# Patient Record
Sex: Male | Born: 1990 | Race: Black or African American | Hispanic: No | Marital: Single | Smoking: Never smoker
Health system: Southern US, Community
[De-identification: ages and names within clinical notes are randomized; demographics above are authoritative.]

---

## 2009-02-19 ENCOUNTER — Emergency Department (HOSPITAL_COMMUNITY): Admission: EM | Admit: 2009-02-19 | Discharge: 2009-02-19 | Payer: Self-pay | Admitting: Emergency Medicine

## 2009-06-21 ENCOUNTER — Emergency Department (HOSPITAL_COMMUNITY): Admission: EM | Admit: 2009-06-21 | Discharge: 2009-06-21 | Payer: Self-pay | Admitting: Emergency Medicine

## 2009-09-30 ENCOUNTER — Emergency Department (HOSPITAL_COMMUNITY): Admission: EM | Admit: 2009-09-30 | Discharge: 2009-09-30 | Payer: Self-pay | Admitting: Emergency Medicine

## 2009-11-12 ENCOUNTER — Emergency Department (HOSPITAL_BASED_OUTPATIENT_CLINIC_OR_DEPARTMENT_OTHER): Admission: EM | Admit: 2009-11-12 | Discharge: 2009-11-12 | Payer: Self-pay | Admitting: Emergency Medicine

## 2009-11-13 ENCOUNTER — Emergency Department (HOSPITAL_BASED_OUTPATIENT_CLINIC_OR_DEPARTMENT_OTHER): Admission: EM | Admit: 2009-11-13 | Discharge: 2009-11-14 | Payer: Self-pay | Admitting: Emergency Medicine

## 2009-11-14 ENCOUNTER — Encounter (INDEPENDENT_AMBULATORY_CARE_PROVIDER_SITE_OTHER): Payer: Self-pay | Admitting: Otolaryngology

## 2009-11-14 ENCOUNTER — Ambulatory Visit: Payer: Self-pay | Admitting: Diagnostic Radiology

## 2009-11-14 ENCOUNTER — Inpatient Hospital Stay (HOSPITAL_COMMUNITY): Admission: EM | Admit: 2009-11-14 | Discharge: 2009-11-16 | Payer: Self-pay | Admitting: Emergency Medicine

## 2010-06-12 LAB — CBC
HCT: 34.8 % — ABNORMAL LOW (ref 39.0–52.0)
HCT: 39 % (ref 39.0–52.0)
MCH: 27 pg (ref 26.0–34.0)
MCHC: 32.2 g/dL (ref 30.0–36.0)
MCV: 80.6 fL (ref 78.0–100.0)
MCV: 83.8 fL (ref 78.0–100.0)
RBC: 4.32 MIL/uL (ref 4.22–5.81)
RBC: 4.66 MIL/uL (ref 4.22–5.81)
RDW: 12.9 % (ref 11.5–15.5)
RDW: 12.2 % (ref 11.5–15.5)
WBC: 19 10*3/uL — ABNORMAL HIGH (ref 4.0–10.5)

## 2010-06-12 LAB — CULTURE, ROUTINE-SINUS

## 2010-06-12 LAB — DIFFERENTIAL
Basophils Absolute: 0 10*3/uL (ref 0.0–0.1)
Basophils Absolute: 0.2 10*3/uL — ABNORMAL HIGH (ref 0.0–0.1)
Basophils Relative: 0 % (ref 0–1)
Eosinophils Absolute: 0 10*3/uL (ref 0.0–0.7)
Eosinophils Absolute: 0 10*3/uL (ref 0.0–0.7)
Lymphocytes Relative: 14 % (ref 12–46)
Lymphocytes Relative: 9 % — ABNORMAL LOW (ref 12–46)
Monocytes Absolute: 3 10*3/uL — ABNORMAL HIGH (ref 0.1–1.0)
Monocytes Relative: 16 % — ABNORMAL HIGH (ref 3–12)
Neutro Abs: 8.2 10*3/uL — ABNORMAL HIGH (ref 1.7–7.7)

## 2010-06-12 LAB — BASIC METABOLIC PANEL
Creatinine, Ser: 1 mg/dL (ref 0.4–1.5)
Potassium: 3.9 mEq/L (ref 3.5–5.1)

## 2010-06-12 LAB — ANAEROBIC CULTURE

## 2014-04-30 ENCOUNTER — Encounter (HOSPITAL_BASED_OUTPATIENT_CLINIC_OR_DEPARTMENT_OTHER): Payer: Self-pay | Admitting: *Deleted

## 2014-04-30 ENCOUNTER — Emergency Department (HOSPITAL_BASED_OUTPATIENT_CLINIC_OR_DEPARTMENT_OTHER)
Admission: EM | Admit: 2014-04-30 | Discharge: 2014-04-30 | Disposition: A | Discharge status: Home / Self Care | Payer: Self-pay | Attending: Emergency Medicine | Admitting: Emergency Medicine

## 2014-04-30 DIAGNOSIS — Z792 Long term (current) use of antibiotics: Secondary | ICD-10-CM | POA: Insufficient documentation

## 2014-04-30 DIAGNOSIS — H109 Unspecified conjunctivitis: Secondary | ICD-10-CM | POA: Insufficient documentation

## 2014-04-30 MED ORDER — FLUORESCEIN SODIUM 1 MG OP STRP
1.0000 | ORAL_STRIP | Freq: Once | OPHTHALMIC | Status: AC
  Administered 2014-04-30: 1 via OPHTHALMIC
  Filled 2014-04-30: qty 1

## 2014-04-30 MED ORDER — ERYTHROMYCIN 5 MG/GM OP OINT: TOPICAL_OINTMENT | OPHTHALMIC | Status: DC

## 2014-04-30 MED ORDER — TETRACAINE HCL 0.5 % OP SOLN
2.0000 [drp] | Freq: Once | OPHTHALMIC | Status: AC
  Administered 2014-04-30: 2 [drp] via OPHTHALMIC
  Filled 2014-04-30: qty 2

## 2014-04-30 NOTE — ED Notes (Signed)
C/o left eye being irritated  Denies inj  Woke w eye irritated

## 2014-04-30 NOTE — Discharge Instructions (Signed)

## 2014-04-30 NOTE — ED Provider Notes (Signed)
CSN: [id number]     Arrival date & time 04/30/14  0326 History   None    Chief Complaint  Patient presents with  . Eye Pain     (Consider location/radiation/quality/duration/timing/severity/associated sxs/prior Treatment) Patient is a 24 y.o. male presenting with conjunctivitis.  Conjunctivitis This is a new problem. The current episode started yesterday. The problem occurs constantly. The problem has not changed since onset.Pertinent negatives include no chest pain, no abdominal pain and no shortness of breath. Nothing aggravates the symptoms. Nothing relieves the symptoms. He has tried a warm compress for the symptoms. The treatment provided no relief.    History reviewed. No pertinent past medical history. History reviewed. No pertinent past surgical history. History reviewed. No pertinent family history. History  Substance Use Topics  . Smoking status: Never Smoker   . Smokeless tobacco: Not on file  . Alcohol Use: Yes    Review of Systems  Respiratory: Negative for shortness of breath.   Cardiovascular: Negative for chest pain.  Gastrointestinal: Negative for abdominal pain.  All other systems reviewed and are negative.     Allergies  Review of patient's allergies indicates no known allergies.  Home Medications   Prior to Admission medications   Medication Sig Start Date End Date Taking? Authorizing Provider  erythromycin ophthalmic ointment Place a 1/2 inch ribbon of ointment into the lower eyelid of the L eye BID x 7 days 04/30/14   Mirian MoMatthew Jebidiah Baggerly, MD   BP 133/68 mmHg  Pulse 68  Temp(Src) 98.8 F (37.1 C) (Oral)  Resp 18  Ht 5\' 10"  (1.778 m)  Wt 205 lb (92.987 kg)  BMI 29.41 kg/m2  SpO2 99% Physical Exam  Constitutional: He is oriented to person, place, and time. He appears well-developed and well-nourished.  HENT:  Head: Normocephalic and atraumatic.  Eyes: EOM are normal. Right eye exhibits no hordeolum. Left eye exhibits no hordeolum. Right conjunctiva  is not injected. Left conjunctiva is injected. No scleral icterus.  Slit lamp exam:      The left eye shows no corneal abrasion, no corneal ulcer, no foreign body and no fluorescein uptake.  Neck: Normal range of motion. Neck supple.  Cardiovascular: Normal rate, regular rhythm and normal heart sounds.   Pulmonary/Chest: Effort normal and breath sounds normal. No respiratory distress.  Abdominal: He exhibits no distension. There is no tenderness. There is no rebound and no guarding.  Musculoskeletal: Normal range of motion.  Neurological: He is alert and oriented to person, place, and time.  Skin: Skin is warm and dry.  Vitals reviewed.   ED Course  Procedures (including critical care time) Labs Review Labs Reviewed - No data to display  Imaging Review No results found.   EKG Interpretation None      MDM   Final diagnoses:  Conjunctivitis of left eye    24 y.o. male with pertinent PMH of prior hordeolum presents with L conjunctivitis.  Visual acuity intact (20:15 OS), PERRL.  No corneal ulcerations, herpetic keratitis.  DC home with conjunctivitis precautions and erythromycin ointment.    I have reviewed all laboratory and imaging studies if ordered as above  1. Conjunctivitis of left eye         Mirian MoMatthew Tyjanae Bartek, MD 04/30/14 [phone]

## 2014-04-30 NOTE — ED Notes (Signed)
C/o left eye irration onset yesterday

## 2015-03-15 ENCOUNTER — Emergency Department (HOSPITAL_BASED_OUTPATIENT_CLINIC_OR_DEPARTMENT_OTHER): Payer: No Typology Code available for payment source

## 2015-03-15 ENCOUNTER — Encounter (HOSPITAL_BASED_OUTPATIENT_CLINIC_OR_DEPARTMENT_OTHER): Payer: Self-pay | Admitting: *Deleted

## 2015-03-15 ENCOUNTER — Ambulatory Visit (HOSPITAL_BASED_OUTPATIENT_CLINIC_OR_DEPARTMENT_OTHER): Admission: RE | Admit: 2015-03-15 | Payer: No Typology Code available for payment source | Source: Ambulatory Visit

## 2015-03-15 ENCOUNTER — Emergency Department: Payer: Self-pay

## 2015-03-15 ENCOUNTER — Emergency Department (HOSPITAL_BASED_OUTPATIENT_CLINIC_OR_DEPARTMENT_OTHER)
Admission: EM | Admit: 2015-03-15 | Discharge: 2015-03-15 | Disposition: A | Discharge status: Home / Self Care | Payer: No Typology Code available for payment source | Attending: Emergency Medicine | Admitting: Emergency Medicine

## 2015-03-15 DIAGNOSIS — Y92410 Unspecified street and highway as the place of occurrence of the external cause: Secondary | ICD-10-CM | POA: Diagnosis not present

## 2015-03-15 DIAGNOSIS — Y9389 Activity, other specified: Secondary | ICD-10-CM | POA: Insufficient documentation

## 2015-03-15 DIAGNOSIS — S199XXA Unspecified injury of neck, initial encounter: Secondary | ICD-10-CM | POA: Diagnosis present

## 2015-03-15 DIAGNOSIS — Y998 Other external cause status: Secondary | ICD-10-CM | POA: Diagnosis not present

## 2015-03-15 DIAGNOSIS — S4991XA Unspecified injury of right shoulder and upper arm, initial encounter: Secondary | ICD-10-CM | POA: Diagnosis not present

## 2015-03-15 DIAGNOSIS — S8991XA Unspecified injury of right lower leg, initial encounter: Secondary | ICD-10-CM | POA: Diagnosis not present

## 2015-03-15 DIAGNOSIS — S161XXA Strain of muscle, fascia and tendon at neck level, initial encounter: Secondary | ICD-10-CM | POA: Diagnosis not present

## 2015-03-15 MED ORDER — NAPROXEN 250 MG PO TABS
500.0000 mg | ORAL_TABLET | Freq: Once | ORAL | Status: AC
  Administered 2015-03-15: 500 mg via ORAL
  Filled 2015-03-15: qty 2

## 2015-03-15 MED ORDER — NAPROXEN SODIUM 550 MG PO TABS: ORAL_TABLET | ORAL | Status: AC
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
## 2015-03-15 NOTE — ED Notes (Signed)
Pt asleep upon entering room to discharge pt and review DC instructions, medicated per EDP orders

## 2018-10-19 ENCOUNTER — Emergency Department (HOSPITAL_COMMUNITY): Payer: Self-pay

## 2018-10-19 ENCOUNTER — Other Ambulatory Visit: Payer: Self-pay

## 2018-10-19 ENCOUNTER — Encounter (HOSPITAL_COMMUNITY): Payer: Self-pay

## 2018-10-19 ENCOUNTER — Emergency Department (HOSPITAL_COMMUNITY)
Admission: EM | Admit: 2018-10-19 | Discharge: 2018-10-19 | Disposition: A | Discharge status: Home / Self Care | Payer: Self-pay | Attending: Emergency Medicine | Admitting: Emergency Medicine

## 2018-10-19 DIAGNOSIS — Y929 Unspecified place or not applicable: Secondary | ICD-10-CM | POA: Insufficient documentation

## 2018-10-19 DIAGNOSIS — Y999 Unspecified external cause status: Secondary | ICD-10-CM | POA: Insufficient documentation

## 2018-10-19 DIAGNOSIS — Y939 Activity, unspecified: Secondary | ICD-10-CM | POA: Insufficient documentation

## 2018-10-19 DIAGNOSIS — W109XXA Fall (on) (from) unspecified stairs and steps, initial encounter: Secondary | ICD-10-CM | POA: Insufficient documentation

## 2018-10-19 DIAGNOSIS — S42014A Posterior displaced fracture of sternal end of right clavicle, initial encounter for closed fracture: Secondary | ICD-10-CM | POA: Insufficient documentation

## 2018-10-19 MED ORDER — OXYCODONE-ACETAMINOPHEN 5-325 MG PO TABS: 1.0000 | ORAL_TABLET | Freq: Four times a day (QID) | ORAL | 0 refills | Status: AC | PRN

## 2018-10-19 NOTE — ED Notes (Signed)
Ortho tech at bedside now. Delayed d/c

## 2018-10-19 NOTE — Discharge Instructions (Signed)
You have a broken clavicle.  Wear sling as provided.  Take the medication as needed.  Call and follow-up with orthopedic doctor in the next week for further evaluation and management.

## 2018-10-19 NOTE — ED Notes (Signed)
Ortho tech called 

## 2018-10-19 NOTE — ED Triage Notes (Signed)
Pt states that he fell down some stairs 3 days ago. Pt states he caught himself, injuring right shoulder.

## 2018-10-19 NOTE — ED Provider Notes (Signed)
[facility]-EMERGENCY DEPT Provider Note   CSN: [id number] Arrival date & time: 10/19/18  1549     History   Chief Complaint Chief Complaint  Patient presents with  . Shoulder Injury    HPI Jason Nelson is a 28 y.o. male.     The history is provided by the patient. No language interpreter was used.     28 year old male presenting for evaluation of right shoulder injury.  Patient report 3 days ago he tripped and fell down the steps, landed directly on his right shoulder.  Since then he has had persistent pain about the shoulder.  Pain is sharp, throbbing, worsening when he try to raise his arm above his shoulder.  Pain is moderate in severity.  He report limited range of motion secondary to the pain.  Pain is nonradiating but worse at nighttime while he sleeps.  He has been taken Tylenol and ibuprofen at home without adequate relief.  He denies any headache, neck pain, chest pain, trouble breathing, focal numbness.  He denies any elbow or wrist pain.  He is right-hand dominant.  History reviewed. No pertinent past medical history.  There are no active problems to display for this patient.   History reviewed. No pertinent surgical history.      Home Medications    Prior to Admission medications   Medication Sig Start Date End Date Taking? Authorizing Provider  naproxen sodium (ANAPROX DS) 550 MG tablet Take 1 tablet every 12 hours as needed for pain. Best taken with a meal.20 03/15/15   Molpus, Jonny RuizJohn, MD    Family History History reviewed. No pertinent family history.  Social History Social History   Tobacco Use  . Smoking status: Never Smoker  Substance Use Topics  . Alcohol use: Yes  . Drug use: Yes    Types: Marijuana     Allergies   Patient has no known allergies.   Review of Systems Review of Systems  Constitutional: Negative for fever.  Musculoskeletal: Positive for arthralgias. Negative for neck pain.  Skin: Negative for  wound.  Neurological: Negative for numbness.     Physical Exam Updated Vital Signs BP 116/69 (BP Location: Left Arm)   Pulse 70   Temp 97.9 F (36.6 C)   Resp 14   Wt 86 kg   SpO2 98%   BMI 27.20 kg/m   Physical Exam Vitals signs and nursing note reviewed.  Constitutional:      General: He is not in acute distress.    Appearance: He is well-developed.  HENT:     Head: Atraumatic.  Eyes:     Conjunctiva/sclera: Conjunctivae normal.  Neck:     Musculoskeletal: Normal range of motion and neck supple. No muscular tenderness.  Cardiovascular:     Rate and Rhythm: Normal rate and regular rhythm.  Pulmonary:     Breath sounds: Normal breath sounds.  Abdominal:     General: There is no distension.     Palpations: Abdomen is soft.  Musculoskeletal:        General: Tenderness (Right shoulder: Tenderness along the right clavicular region and right trapezius on palpation with decreased shoulder extension and shoulder raise secondary to pain without any obvious deformity.  No tenderness to right elbow or right wrist.) present.  Skin:    Findings: No rash.  Neurological:     Mental Status: He is alert.      ED Treatments / Results  Labs (all labs ordered are listed, but only abnormal  results are displayed) Labs Reviewed - No data to display  EKG None  Radiology Dg Clavicle Right  Result Date: 10/19/2018 CLINICAL DATA:  Medial right clavicle pain after falling down stairs yesterday. EXAM: RIGHT CLAVICLE - 2+ VIEWS COMPARISON:  None. FINDINGS: There is a minimally displaced fracture of the medial aspect of right clavicle. Otherwise normal. IMPRESSION: Fracture of the medial aspect of the right clavicle. Electronically Signed   By: Lorriane Shire M.D.   On: 10/19/2018 16:53   Dg Shoulder Right  Result Date: 10/19/2018 CLINICAL DATA:  Medial right clavicle pain after falling down stairs yesterday. EXAM: RIGHT SHOULDER - 2+ VIEW COMPARISON:  03/15/2015 FINDINGS: There is a  minimally displaced fracture of the medial aspect of the right clavicle adjacent to the sternoclavicular joint. The remainder of the right clavicle is normal. Glenohumeral joint and AC joint are normal. IMPRESSION: Minimally displaced fracture of the medial aspect of the right clavicle. Normal right shoulder. Electronically Signed   By: Lorriane Shire M.D.   On: 10/19/2018 16:50    Procedures Procedures (including critical care time)  Medications Ordered in ED Medications - No data to display   Initial Impression / Assessment and Plan / ED Course  I have reviewed the triage vital signs and the nursing notes.  Pertinent labs & imaging results that were available during my care of the patient were reviewed by me and considered in my medical decision making (see chart for details).        BP 116/69 (BP Location: Left Arm)   Pulse 70   Temp 97.9 F (36.6 C)   Resp 14   Wt 86 kg   SpO2 98%   BMI 27.20 kg/m    Final Clinical Impressions(s) / ED Diagnoses   Final diagnoses:  Closed posterior displaced fracture of sternal end of right clavicle, initial encounter    ED Discharge Orders         Ordered    oxyCODONE-acetaminophen (PERCOCET) 5-325 MG tablet  Every 6 hours PRN     10/19/18 1714         4:29 PM Patient fell 3 days ago and then directly on his right shoulder.  He has tenderness along his right clavicle region.  Shoulder without any obvious deformity.  Will obtain appropriate x-rays.  He is otherwise neurovascularly intact.  He does have intermittent range of motion which include difficulty elevating his arm above the shoulder.  5:00 PM X-ray demonstrate a minimally displaced fracture of the medial aspect of the right clavicle with normal right shoulder.  This finding is consistent with patient's presenting complaint.  Will place shoulder in a shoulder sling immobilizer.  Pain medication prescribed.  Orthopedic referral given as needed.  Return precaution discussed.   Patient is neurovascular intact.   Domenic Moras, PA-C 10/19/18 1715    Gareth Morgan, MD 10/20/18 [id number]

## 2020-01-31 IMAGING — CR RIGHT SHOULDER - 2+ VIEW
3 series · 3 of 3 positions shown · non-contrast
Comparison: 03/15/2015

CLINICAL DATA: Medial right clavicle pain after falling down stairs
yesterday.

EXAM:
RIGHT SHOULDER - 2+ VIEW

[w shoulder external right]
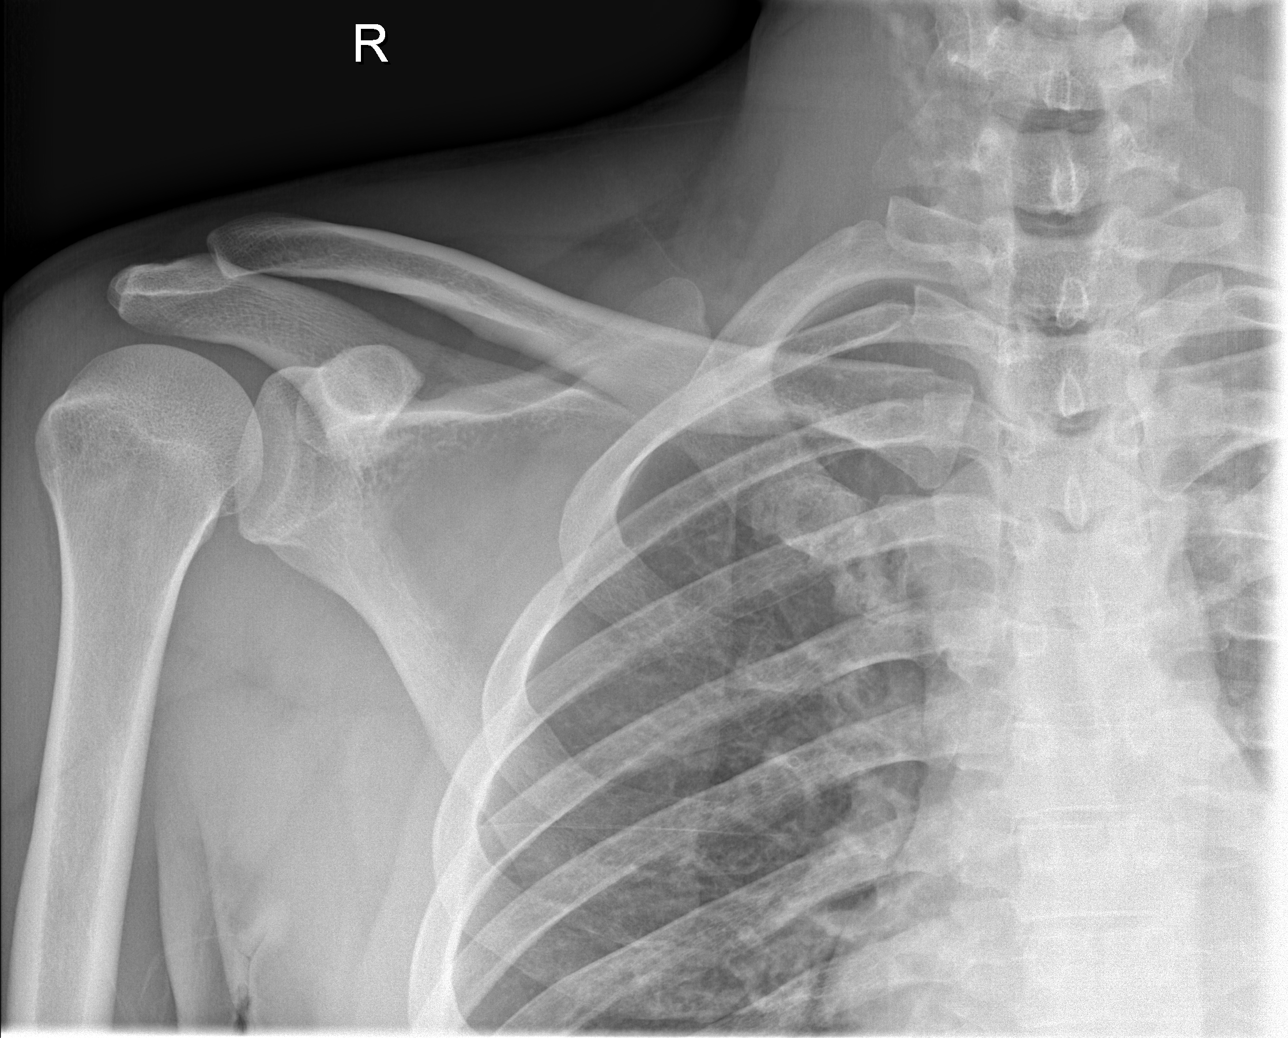

[w shoulder y-view right (1 of 2)]
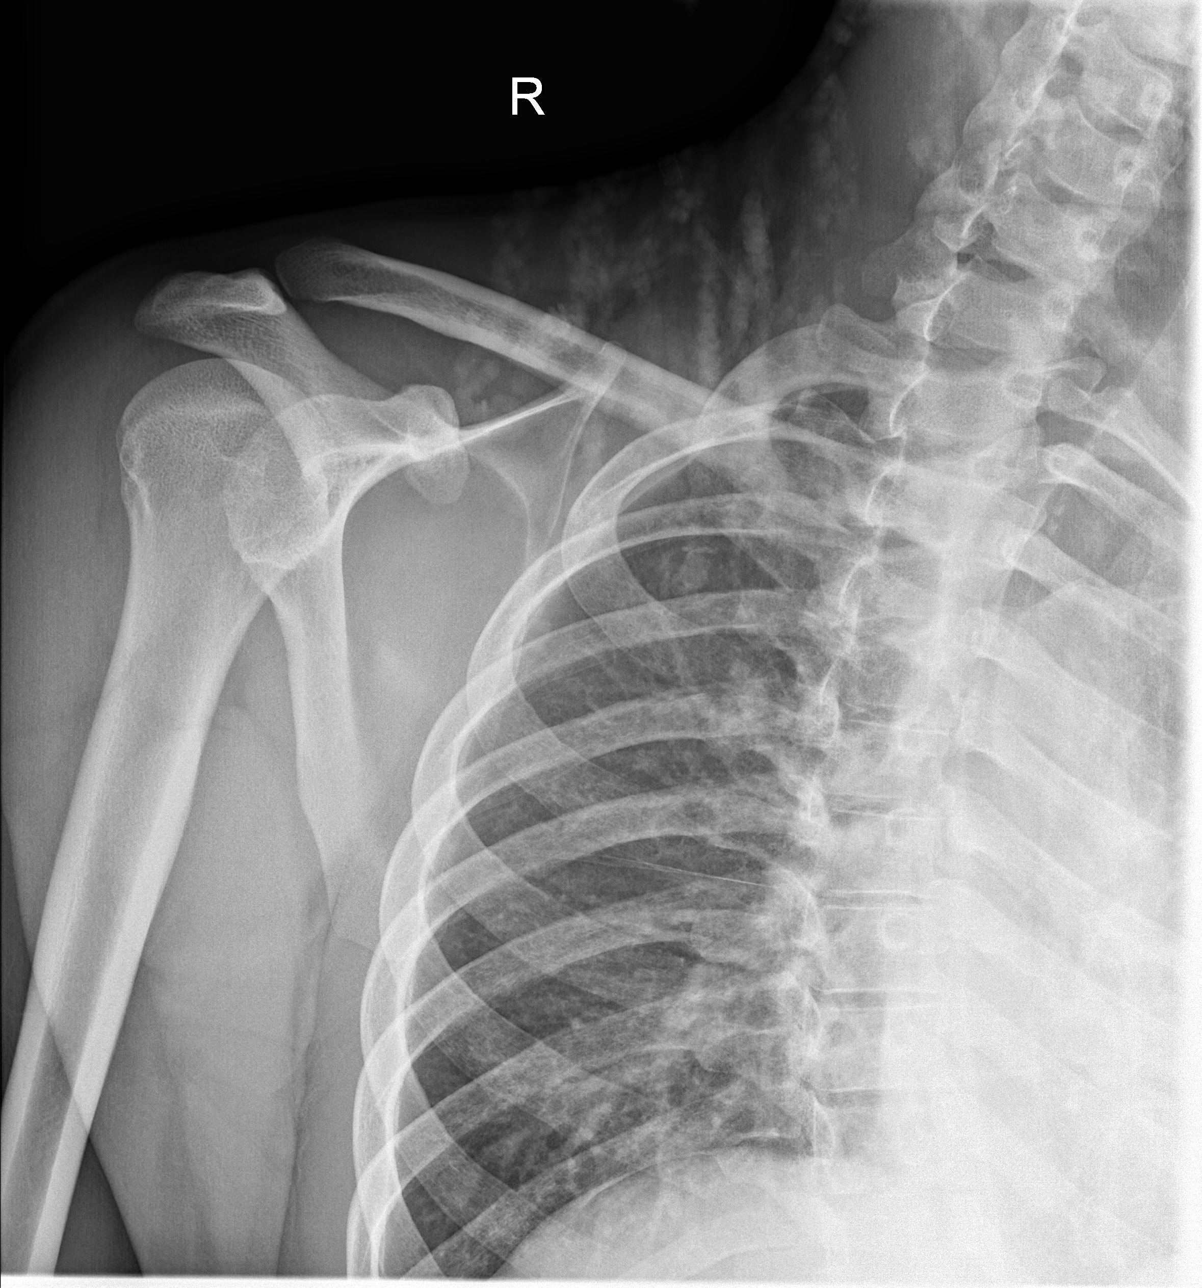

[w shoulder y-view right (2 of 2)]
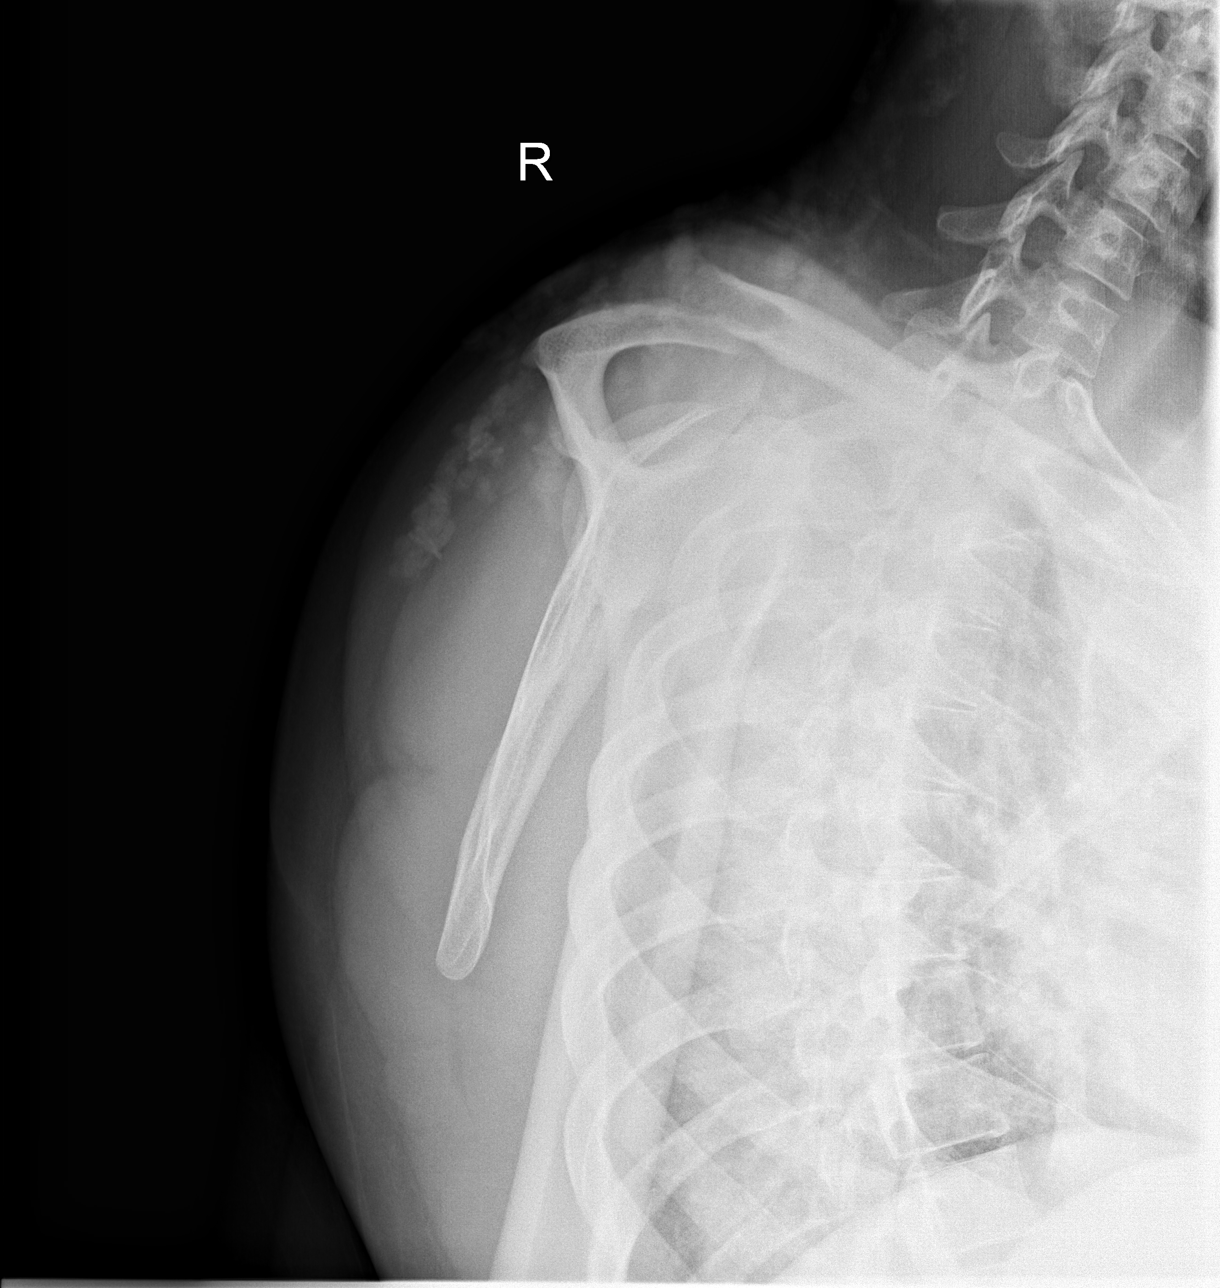

[3 of 3 positions shown; findings below may reference images not displayed]

FINDINGS: There is a minimally displaced fracture of the medial aspect of the
right clavicle adjacent to the sternoclavicular joint. The remainder
of the right clavicle is normal. Glenohumeral joint and AC joint are
normal.
IMPRESSION: Minimally displaced fracture of the medial aspect of the right
clavicle. Normal right shoulder.

## 2022-08-28 ENCOUNTER — Encounter (HOSPITAL_BASED_OUTPATIENT_CLINIC_OR_DEPARTMENT_OTHER): Payer: Self-pay | Admitting: Emergency Medicine

## 2022-08-28 ENCOUNTER — Emergency Department (HOSPITAL_BASED_OUTPATIENT_CLINIC_OR_DEPARTMENT_OTHER): Payer: Medicaid - Out of State

## 2022-08-28 ENCOUNTER — Emergency Department (HOSPITAL_BASED_OUTPATIENT_CLINIC_OR_DEPARTMENT_OTHER)
Admission: EM | Admit: 2022-08-28 | Discharge: 2022-08-28 | Disposition: A | Discharge status: Home / Self Care | Payer: Medicaid - Out of State | Attending: Emergency Medicine | Admitting: Emergency Medicine

## 2022-08-28 DIAGNOSIS — S43122A Dislocation of left acromioclavicular joint, 100%-200% displacement, initial encounter: Secondary | ICD-10-CM | POA: Insufficient documentation

## 2022-08-28 DIAGNOSIS — S4992XA Unspecified injury of left shoulder and upper arm, initial encounter: Secondary | ICD-10-CM | POA: Diagnosis present

## 2022-08-28 DIAGNOSIS — Y9367 Activity, basketball: Secondary | ICD-10-CM | POA: Insufficient documentation

## 2022-08-28 DIAGNOSIS — W1789XA Other fall from one level to another, initial encounter: Secondary | ICD-10-CM | POA: Diagnosis not present

## 2022-08-28 DIAGNOSIS — S43102A Unspecified dislocation of left acromioclavicular joint, initial encounter: Secondary | ICD-10-CM

## 2022-08-28 MED ORDER — ACETAMINOPHEN 500 MG PO TABS
1000.0000 mg | ORAL_TABLET | Freq: Once | ORAL | Status: AC
  Administered 2022-08-28: 1000 mg via ORAL
  Filled 2022-08-28: qty 2

## 2022-08-28 MED ORDER — CELECOXIB 200 MG PO CAPS: 200.0000 mg | ORAL_CAPSULE | Freq: Two times a day (BID) | ORAL | 0 refills | Status: DC

## 2022-08-28 MED ORDER — KETOROLAC TROMETHAMINE 15 MG/ML IJ SOLN
30.0000 mg | Freq: Once | INTRAMUSCULAR | Status: AC
  Administered 2022-08-28: 30 mg via INTRAMUSCULAR
  Filled 2022-08-28: qty 2
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
## 2022-08-28 NOTE — ED Triage Notes (Signed)
Pt sts he fell 2 days ago while playing basketball; c/o LT shoulder and neck pain

## 2022-08-28 NOTE — Discharge Instructions (Addendum)
You were seen today for shoulder pain. We did not identify any emergent cause for your symptoms. Your evaluation is most consistent with [facility] Joint separation.   Plan and next steps:   The following may be helpful in managing your symptoms:   Pain- Lidocaine Patches  Apply to affected area for up to 12 hours at a time.   Pain/Fever- Adult Tylenol dosing:  650 mg orally every 4 to 6 hours as needed, MAX: 3250 mg/24 hours   (Extra-strength) 1000 mg orally every 6 hours as needed; MAX: 3000 mg/24 hours   Do not use if you have liver disease. Read the label on the bottle.   Pain: Celecoxib (Extra-strength) 200 mg orally every 8 hours as needed; MAX: 400 mg/24 hours   Do not use if you have kidney disease/take blood thinners/ have a history of GI bleeding. Read the label on the bottle.   Findings:  You may see all of your lab and imaging results utilizing our online portal! Look in this document or ask a team member for your mychart* access information. The most notable results have additionally been verbally communicated with you and your bedside family.    Follow-up Plan:   Follow up with the patient's normal primary care provider for monitoring of this condition within 48 hours.   Signs/Symptoms that would warrant return to the ED:  Please return to the ED if you experience worsening of symptoms or any abrupt changes in your health. Standard of care precautions for your chief complaint have already been verbally communicated with you. Always be on alert for fevers, chills, shortness of breath, chest pains, or sudden changes that warrant immediate evaluation.    Thank you for allowing [place] to be a part of you and your families' care.   Glyn Ade MD

## 2022-09-29 ENCOUNTER — Other Ambulatory Visit: Payer: Self-pay

## 2022-09-29 ENCOUNTER — Encounter (HOSPITAL_BASED_OUTPATIENT_CLINIC_OR_DEPARTMENT_OTHER): Payer: Self-pay

## 2022-09-29 ENCOUNTER — Emergency Department (HOSPITAL_BASED_OUTPATIENT_CLINIC_OR_DEPARTMENT_OTHER)
Admission: EM | Admit: 2022-09-29 | Discharge: 2022-09-29 | Disposition: A | Discharge status: Home / Self Care | Payer: Medicaid - Out of State | Attending: Emergency Medicine | Admitting: Emergency Medicine

## 2022-09-29 ENCOUNTER — Other Ambulatory Visit (HOSPITAL_BASED_OUTPATIENT_CLINIC_OR_DEPARTMENT_OTHER): Payer: Self-pay

## 2022-09-29 DIAGNOSIS — K029 Dental caries, unspecified: Secondary | ICD-10-CM | POA: Insufficient documentation

## 2022-09-29 DIAGNOSIS — K0889 Other specified disorders of teeth and supporting structures: Secondary | ICD-10-CM | POA: Diagnosis present

## 2022-09-29 DIAGNOSIS — R0603 Acute respiratory distress: Secondary | ICD-10-CM | POA: Diagnosis not present

## 2022-09-29 MED ORDER — OXYCODONE HCL 5 MG PO TABS
5.0000 mg | ORAL_TABLET | Freq: Four times a day (QID) | ORAL | 0 refills | Status: AC | PRN
  Filled 2022-09-29: qty 10, 3d supply, fill #0

## 2022-09-29 MED ORDER — AMOXICILLIN 500 MG PO CAPS
500.0000 mg | ORAL_CAPSULE | Freq: Three times a day (TID) | ORAL | 0 refills | Status: DC
  Filled 2022-09-29: qty 21, 7d supply, fill #0

## 2022-09-29 MED ORDER — KETOROLAC TROMETHAMINE 60 MG/2ML IM SOLN
30.0000 mg | Freq: Once | INTRAMUSCULAR | Status: AC
  Administered 2022-09-29: 30 mg via INTRAMUSCULAR
  Filled 2022-09-29: qty 2

## 2022-09-29 NOTE — ED Provider Notes (Signed)
[place] [facility] Provider Note   CSN: [id number] Arrival date & time: 09/29/22  1416     History  Chief Complaint  Patient presents with   Dental Pain    Jason Nelson is a 32 y.o. male.  Patient here with right upper dental pain.  Symptoms for the last month or so but worse last night after the last 1-2 fell out.  Denies any fevers or chills.  No problems eating or drinking.  No trismus.  No drooling.  Does not have a dentist.  Been taking over-the-counter medications without much relief.  The history is provided by the patient.       Home Medications Prior to Admission medications   Medication Sig Start Date End Date Taking? Authorizing Provider  amoxicillin (AMOXIL) 500 MG capsule Take 1 capsule (500 mg total) by mouth 3 (three) times daily. 09/29/22  Yes Mordechai Matuszak, DO  oxyCODONE (ROXICODONE) 5 MG immediate release tablet Take 1 tablet (5 mg total) by mouth every 6 (six) hours as needed for up to 10 doses for breakthrough pain. 09/29/22  Yes Aryn Kops, DO  celecoxib (CELEBREX) 200 MG capsule Take 1 capsule (200 mg total) by mouth 2 (two) times daily. 08/28/22   Glyn Ade, MD  naproxen sodium (ANAPROX DS) 550 MG tablet Take 1 tablet every 12 hours as needed for pain. Best taken with a meal.20 03/15/15   Molpus, Jonny Ruiz, MD  oxyCODONE-acetaminophen (PERCOCET) 5-325 MG tablet Take 1 tablet by mouth every 6 (six) hours as needed for moderate pain or severe pain. 10/19/18   Fayrene Helper, PA-C      Allergies    Patient has no known allergies.    Review of Systems   Review of Systems  Physical Exam Updated Vital Signs BP 124/85 (BP Location: Left Arm)   Pulse 63   Temp 97.7 F (36.5 C)   Resp 18   Ht 5\' 10"  (1.778 m)   Wt 97.5 kg   SpO2 96%   BMI 30.85 kg/m  Physical Exam Vitals and nursing note reviewed.  Constitutional:      General: He is not in acute distress.    Appearance: He is well-developed. He is not  ill-appearing.  HENT:     Head: Normocephalic and atraumatic.     Comments: Right upper tooth with some dental caries, no obvious abscess or drainage or facial swelling, no trismus or drooling    Nose: Nose normal.  Pulmonary:     Effort: Respiratory distress present.  Abdominal:     Palpations: Abdomen is soft.  Musculoskeletal:        General: No swelling.     Cervical back: Normal range of motion and neck supple.  Skin:    General: Skin is warm and dry.     Capillary Refill: Capillary refill takes less than 2 seconds.  Neurological:     Mental Status: He is alert.  Psychiatric:        Mood and Affect: Mood normal.     ED Results / Procedures / Treatments   Labs (all labs ordered are listed, but only abnormal results are displayed) Labs Reviewed - No data to display  EKG None  Radiology No results found.  Procedures Procedures    Medications Ordered in ED Medications  ketorolac (TORADOL) injection 30 mg (has no administration in time range)    ED Course/ Medical Decision Making/ A&P  Medical Decision Making Risk Prescription drug management.   Jason Nelson is here with right upper dental pain.  Normal vitals.  No fever.  Will give him a shot of Toradol.  Prescribed him antibiotics.  Will give him Roxicodone for breakthrough pain.  Recommend Tylenol and ibuprofen for pain as well.  Will refer him to a dentist.  I have no concern for major infectious process at this time.  He has dental caries.  No facial swelling or submandibular swelling.  No trismus or drooling.  Discharged in good condition.  This chart was dictated using voice recognition software.  Despite best efforts to proofread,  errors can occur which can change the documentation meaning.         Final Clinical Impression(s) / ED Diagnoses Final diagnoses:  Pain, dental    Rx / DC Orders ED Discharge Orders          Ordered    oxyCODONE (ROXICODONE) 5 MG  immediate release tablet  Every 6 hours PRN        09/29/22 1433    amoxicillin (AMOXIL) 500 MG capsule  3 times daily        09/29/22 1433              Schyler Butikofer, DO 09/29/22 1435

## 2022-09-29 NOTE — ED Triage Notes (Signed)
States had a chipped tooth in right upper  jaw x 1 month, last night the rest of the tooth came out. C/o dental pain. Denies having a Education officer, community.

## 2023-03-18 ENCOUNTER — Encounter (HOSPITAL_BASED_OUTPATIENT_CLINIC_OR_DEPARTMENT_OTHER): Payer: Self-pay

## 2023-03-18 ENCOUNTER — Other Ambulatory Visit (HOSPITAL_BASED_OUTPATIENT_CLINIC_OR_DEPARTMENT_OTHER): Payer: Self-pay

## 2023-03-18 ENCOUNTER — Other Ambulatory Visit: Payer: Self-pay

## 2023-03-18 ENCOUNTER — Emergency Department (HOSPITAL_BASED_OUTPATIENT_CLINIC_OR_DEPARTMENT_OTHER)
Admission: EM | Admit: 2023-03-18 | Discharge: 2023-03-18 | Disposition: A | Discharge status: Home / Self Care | Payer: Medicaid Other | Attending: Emergency Medicine | Admitting: Emergency Medicine

## 2023-03-18 DIAGNOSIS — K0889 Other specified disorders of teeth and supporting structures: Secondary | ICD-10-CM | POA: Insufficient documentation

## 2023-03-18 MED ORDER — AMOXICILLIN 500 MG PO CAPS
500.0000 mg | ORAL_CAPSULE | Freq: Three times a day (TID) | ORAL | 0 refills | Status: AC
  Filled 2023-03-18: qty 21, 7d supply, fill #0

## 2023-03-18 NOTE — ED Provider Notes (Signed)
[place] [facility] Provider Note   CSN: [id number] Arrival date & time: 03/18/23  1036     History  Chief Complaint  Patient presents with   Dental Problem    Jason Nelson is a 32 y.o. male.  HPI 32 year old male presenting for dental pain.  Started last night.  It is on his right maxillary side of his teeth.  He had pain here before and has a missing tooth in this area.  He is at trouble chewing due to pain.  No trouble swallowing, no swelling to his neck or face.  No difficulty opening his mouth or shortness of breath.  No fevers or chills.  Trying to get in with a dentist.     Home Medications Prior to Admission medications   Medication Sig Start Date End Date Taking? Authorizing Provider  amoxicillin (AMOXIL) 500 MG capsule Take 1 capsule (500 mg total) by mouth 3 (three) times daily. 03/18/23   Laurence Spates, MD  celecoxib (CELEBREX) 200 MG capsule Take 1 capsule (200 mg total) by mouth 2 (two) times daily. 08/28/22   Glyn Ade, MD  naproxen sodium (ANAPROX DS) 550 MG tablet Take 1 tablet every 12 hours as needed for pain. Best taken with a meal.20 03/15/15   Molpus, Jonny Ruiz, MD  oxyCODONE (ROXICODONE) 5 MG immediate release tablet Take 1 tablet (5 mg total) by mouth every 6 (six) hours as needed for up to 10 doses for breakthrough pain. 09/29/22   Curatolo, Adam, DO  oxyCODONE-acetaminophen (PERCOCET) 5-325 MG tablet Take 1 tablet by mouth every 6 (six) hours as needed for moderate pain or severe pain. 10/19/18   Fayrene Helper, PA-C      Allergies    Patient has no known allergies.    Review of Systems   Review of Systems Review of systems completed and notable as per HPI.  ROS otherwise negative.   Physical Exam Updated Vital Signs BP (!) 140/87 (BP Location: Left Arm)   Pulse 86   Temp 98.4 F (36.9 C) (Oral)   Resp 16   Ht 5\' 10"  (1.778 m)   Wt 97 kg   SpO2 99%   BMI 30.68 kg/m  Physical Exam Vitals and nursing  note reviewed.  Constitutional:      General: He is not in acute distress.    Appearance: He is well-developed.  HENT:     Head: Normocephalic and atraumatic.     Nose: Nose normal.     Mouth/Throat:     Mouth: Mucous membranes are moist.     Pharynx: Oropharynx is clear.     Comments: Generally poor dentition.  Missing tooth as above.  He has some mild tenderness and erythema to the gums here but no fluctuance or drainable abscess.  He has no swelling under the mouth, under the tongue, submandibular area.  No cervical lymphadenopathy.  No trismus.  No evidence of peritonsillar abscess. Eyes:     Extraocular Movements: Extraocular movements intact.     Conjunctiva/sclera: Conjunctivae normal.     Pupils: Pupils are equal, round, and reactive to light.  Cardiovascular:     Rate and Rhythm: Normal rate and regular rhythm.     Heart sounds: No murmur heard. Pulmonary:     Effort: Pulmonary effort is normal. No respiratory distress.     Breath sounds: Normal breath sounds.  Abdominal:     Palpations: Abdomen is soft.     Tenderness: There is no abdominal  tenderness. There is no guarding or rebound.  Musculoskeletal:        General: No swelling.     Cervical back: Neck supple.  Skin:    General: Skin is warm and dry.     Capillary Refill: Capillary refill takes less than 2 seconds.  Neurological:     General: No focal deficit present.     Mental Status: He is alert and oriented to person, place, and time. Mental status is at baseline.  Psychiatric:        Mood and Affect: Mood normal.     ED Results / Procedures / Treatments   Labs (all labs ordered are listed, but only abnormal results are displayed) Labs Reviewed - No data to display  EKG None  Radiology No results found.  Procedures Procedures    Medications Ordered in ED Medications - No data to display  ED Course/ Medical Decision Making/ A&P                                 Medical Decision  Making  Medical Decision Making:   Jason Nelson is a 32 y.o. male who presented to the ED today with dental pain.  Hemodynamically stable.  He is well-appearing, he has poor dentition and a missing tooth with some erythema of the gums over the affected airway.  No visible abscess.  He is no trismus or signs of deep space infection including Ludwig's angina, PTA, RPA, cellulitis.  I do wonder if he could have reversible pulpitis and early dental infection.  Will place on antibiotics and I gave him a list of dental resources.  I given strict return precautions and was clear that he needed to follow-up closely with a dentist for definitive management.   Patient placed on continuous vitals and telemetry monitoring while in ED which was reviewed periodically.  Reviewed and confirmed nursing documentation for past medical history, family history, social history.  Patient's presentation is most consistent with acute, uncomplicated illness.           Final Clinical Impression(s) / ED Diagnoses Final diagnoses:  Pain, dental    Rx / DC Orders ED Discharge Orders          Ordered    amoxicillin (AMOXIL) 500 MG capsule  3 times daily        03/18/23 1252              Laurence Spates, MD 03/18/23 1252

## 2023-03-18 NOTE — ED Triage Notes (Signed)
The patient having dental pain since last night. No fever.

## 2023-03-18 NOTE — Discharge Instructions (Addendum)
You have a possible dental infection.  You are being placed on antibiotics and you need to call the numbers above to schedule close follow-up with a dentist ideally within the next week.  If you develop severe fever, difficulty opening her mouth, difficulty swallowing or breathing or severe pain or swelling you should return to the ED.

## 2023-04-16 ENCOUNTER — Other Ambulatory Visit: Payer: Self-pay

## 2023-04-16 ENCOUNTER — Emergency Department (HOSPITAL_BASED_OUTPATIENT_CLINIC_OR_DEPARTMENT_OTHER)
Admission: EM | Admit: 2023-04-16 | Discharge: 2023-04-16 | Disposition: A | Discharge status: Home / Self Care | Payer: Medicaid Other | Attending: Emergency Medicine | Admitting: Emergency Medicine

## 2023-04-16 ENCOUNTER — Encounter (HOSPITAL_BASED_OUTPATIENT_CLINIC_OR_DEPARTMENT_OTHER): Payer: Self-pay

## 2023-04-16 DIAGNOSIS — K0889 Other specified disorders of teeth and supporting structures: Secondary | ICD-10-CM

## 2023-04-16 DIAGNOSIS — K047 Periapical abscess without sinus: Secondary | ICD-10-CM | POA: Insufficient documentation

## 2023-04-16 MED ORDER — CELECOXIB 200 MG PO CAPS: 200.0000 mg | ORAL_CAPSULE | Freq: Two times a day (BID) | ORAL | 0 refills | Status: AC | PRN

## 2023-04-16 MED ORDER — BENZOCAINE 20 % MT AERO
INHALATION_SPRAY | Freq: Once | OROMUCOSAL | Status: DC
  Filled 2023-04-16: qty 57

## 2023-04-16 MED ORDER — CHLORHEXIDINE GLUCONATE 0.12 % MT SOLN: 15.0000 mL | Freq: Two times a day (BID) | OROMUCOSAL | 0 refills | Status: AC

## 2023-04-16 MED ORDER — LIDOCAINE HCL (PF) 1 % IJ SOLN
5.0000 mL | Freq: Once | INTRAMUSCULAR | Status: DC
  Filled 2023-04-16: qty 5

## 2023-04-16 MED ORDER — CLINDAMYCIN HCL 300 MG PO CAPS: 300.0000 mg | ORAL_CAPSULE | Freq: Three times a day (TID) | ORAL | 0 refills | Status: AC

## 2023-04-16 NOTE — ED Triage Notes (Signed)
Pt presents with complaint right upper toothache and abscess. Appt with 04/25/23 with dentist. Pain increased last night and face feels swollen

## 2023-04-16 NOTE — ED Provider Notes (Signed)
[place] [facility] Provider Note   CSN: [id number] Arrival date & time: 04/16/23  6283     History  Chief Complaint  Patient presents with   Dental Pain    Jason Nelson is a 33 y.o. male.   Dental Pain   33 year old male presents to the emergency department with complaints of right upper dental pain.  Patient has known broken tooth in area with intermittent pain.  States the pain acutely worsened last night and noticed swelling along his gumline concern for abscess.  States he has an appoint with the dentist on the 27th of this month but due to increased pain, presented to the emergency department for reevaluation.  Denies any difficulty breathing/swallowing, tongue swelling or floor of mouth swelling.  Denies any additional symptoms.  No significant pertinent past medical history.  Home Medications Prior to Admission medications   Medication Sig Start Date End Date Taking? Authorizing Provider  celecoxib (CELEBREX) 200 MG capsule Take 1 capsule (200 mg total) by mouth 2 (two) times daily as needed. 04/16/23  Yes Sherian Maroon A, PA  chlorhexidine (PERIDEX) 0.12 % solution Use as directed 15 mLs in the mouth or throat 2 (two) times daily. 04/16/23  Yes Sherian Maroon A, PA  clindamycin (CLEOCIN) 300 MG capsule Take 1 capsule (300 mg total) by mouth 3 (three) times daily for 10 days. 04/16/23 04/26/23 Yes Sherian Maroon A, PA  amoxicillin (AMOXIL) 500 MG capsule Take 1 capsule (500 mg total) by mouth 3 (three) times daily. 03/18/23   Laurence Spates, MD  naproxen sodium (ANAPROX DS) 550 MG tablet Take 1 tablet every 12 hours as needed for pain. Best taken with a meal.20 03/15/15   Molpus, Jonny Ruiz, MD  oxyCODONE (ROXICODONE) 5 MG immediate release tablet Take 1 tablet (5 mg total) by mouth every 6 (six) hours as needed for up to 10 doses for breakthrough pain. 09/29/22   Curatolo, Adam, DO  oxyCODONE-acetaminophen (PERCOCET) 5-325 MG tablet Take 1  tablet by mouth every 6 (six) hours as needed for moderate pain or severe pain. 10/19/18   Fayrene Helper, PA-C      Allergies    Patient has no known allergies.    Review of Systems   Review of Systems  All other systems reviewed and are negative.   Physical Exam Updated Vital Signs BP (!) 128/91 (BP Location: Left Arm)   Pulse 73   Temp 98.5 F (36.9 C)   Resp 18   Wt 99.8 kg   SpO2 96%   BMI 31.57 kg/m  Physical Exam Vitals and nursing note reviewed.  Constitutional:      General: He is not in acute distress.    Appearance: He is well-developed.  HENT:     Head: Normocephalic and atraumatic.     Comments: Uvula midline rise symmetric with phonation.  No sublingual extremity below swelling.  No trismus.  Dentition poor repair.  Tonsils 1+ bilaterally without exudate.    Mouth/Throat:      Comments: 2 fractured molars as above.  Palpable fluctuance along gumline. Eyes:     Conjunctiva/sclera: Conjunctivae normal.  Cardiovascular:     Rate and Rhythm: Normal rate and regular rhythm.     Heart sounds: No murmur heard. Pulmonary:     Effort: Pulmonary effort is normal. No respiratory distress.     Breath sounds: Normal breath sounds.  Abdominal:     Palpations: Abdomen is soft.     Tenderness: There  is no abdominal tenderness.  Musculoskeletal:        General: No swelling.     Cervical back: Neck supple.  Skin:    General: Skin is warm and dry.     Capillary Refill: Capillary refill takes less than 2 seconds.  Neurological:     Mental Status: He is alert.  Psychiatric:        Mood and Affect: Mood normal.     ED Results / Procedures / Treatments   Labs (all labs ordered are listed, but only abnormal results are displayed) Labs Reviewed - No data to display  EKG None  Radiology No results found.  Procedures .Incision and Drainage  Date/Time: 04/16/2023 10:02 AM  Performed by: Peter Garter, PA Authorized by: Peter Garter, PA   Consent:     Consent obtained:  Verbal   Consent given by:  Patient   Risks discussed:  Bleeding, incomplete drainage, pain and damage to other organs   Alternatives discussed:  No treatment Universal protocol:    Procedure explained and questions answered to patient or proxy's satisfaction: yes     Relevant documents present and verified: yes     Test results available : yes     Imaging studies available: yes     Required blood products, implants, devices, and special equipment available: yes     Site/side marked: yes     Immediately prior to procedure, a time out was called: yes     Patient identity confirmed:  Verbally with patient Location:    Type:  Abscess   Size:  Periapical abscess   Location:  Mouth Anesthesia:    Anesthesia method:  Local infiltration and topical application   Topical anesthesia: hurricane spray.   Local anesthetic:  Lidocaine 1% w/o epi Procedure type:    Complexity:  Complex Procedure details:    Incision types:  Single straight   Incision depth:  Subcutaneous   Wound management:  Probed and deloculated, irrigated with saline and extensive cleaning   Drainage:  Purulent   Drainage amount:  Moderate   Packing materials:  None Post-procedure details:    Procedure completion:  Tolerated well, no immediate complications     Medications Ordered in ED Medications  lidocaine (PF) (XYLOCAINE) 1 % injection 5 mL (has no administration in time range)  Benzocaine (HURRCAINE) 20 % mouth spray (has no administration in time range)    ED Course/ Medical Decision Making/ A&P                                 Medical Decision Making Risk Prescription drug management.   This patient presents to the ED for concern of dental pain, this involves an extensive number of treatment options, and is a complaint that carries with it a high risk of complications and morbidity.  The differential diagnosis includes cellulitis, necrotizing infection,.  Will assess, peritonsillar  abscess, Ludwig angina, other   Co morbidities that complicate the patient evaluation  See HPI   Additional history obtained:  Additional history obtained from EMR External records from outside source obtained and reviewed including records   Lab Tests:  N/a   Imaging Studies ordered:  N/a   Cardiac Monitoring: / EKG:  The patient was maintained on a cardiac monitor.  I personally viewed and interpreted the cardiac monitored which showed an underlying rhythm of: sinus rhythm   Consultations Obtained:  N/a  Problem List / ED Course / Critical interventions / Medication management  Dental pain, periapical abscess I ordered medication including HurriCaine spray, lidocaine   Reevaluation of the patient after these medicines showed that the patient improved I have reviewed the patients home medicines and have made adjustments as needed   Social Determinants of Health:  Denies tobacco, illicit drug use   Test / Admission - Considered:  Dental pain, periapical abscess Vitals signs within normal range and stable throughout visit. 33 year old male presents emergency department with complaints of dental pain.  Patient with known fractured tooth with acute on chronic dental pain that began yesterday.  On exam, evidence of periapical abscess.  No clinical evidence of Ludwig angina.  Abscess drained in manner as above.  Will place patient on antibiotics and recommend use of NSAIDs for pain.  Patient already has follow-up with dental specialist on the 27th of this month.  Treatment plan discussed at length with patient and he acknowledged understanding was agreeable to said plan.  Patient overall well-appearing, afebrile in no acute distress. Worrisome signs and symptoms were discussed with the patient, and the patient acknowledged understanding to return to the ED if noticed. Patient was stable upon discharge.          Final Clinical Impression(s) / ED  Diagnoses Final diagnoses:  Periapical abscess  Pain, dental    Rx / DC Orders ED Discharge Orders          Ordered    clindamycin (CLEOCIN) 300 MG capsule  3 times daily        04/16/23 1000    celecoxib (CELEBREX) 200 MG capsule  2 times daily PRN        04/16/23 1000    chlorhexidine (PERIDEX) 0.12 % solution  2 times daily        04/16/23 1000              Peter Garter, [place] 04/16/23 1004    Pricilla Loveless, MD 04/17/23 [phone]

## 2023-04-16 NOTE — Discharge Instructions (Addendum)
Continue take anti-inflammatories as needed for pain.  Will put on antibiotics given concern for infection.  Follow-up with your dental specialist in the outpatient setting.  Please not hesitate to return if the worrisome signs and symptoms we discussed become apparent.
